# Patient Record
Sex: Male | Born: 1980 | Race: Black or African American | Hispanic: No | Marital: Single | Smoking: Current every day smoker
Health system: Southern US, Community
[De-identification: ages and names within clinical notes are randomized; demographics above are authoritative.]

## PROBLEM LIST (undated history)

## (undated) ENCOUNTER — Emergency Department
Admission: EM | Payer: BC Managed Care – PPO | Source: Home / Self Care | Attending: Emergency Medicine | Admitting: Emergency Medicine

## (undated) DIAGNOSIS — M1990 Unspecified osteoarthritis, unspecified site: Secondary | ICD-10-CM

## (undated) DIAGNOSIS — I10 Essential (primary) hypertension: Secondary | ICD-10-CM

---

## 2006-07-02 ENCOUNTER — Emergency Department: Payer: Self-pay | Admitting: Emergency Medicine

## 2007-01-13 ENCOUNTER — Emergency Department: Payer: Self-pay

## 2007-03-03 ENCOUNTER — Emergency Department: Payer: Self-pay | Admitting: Emergency Medicine

## 2007-03-04 ENCOUNTER — Emergency Department: Payer: Self-pay | Admitting: Emergency Medicine

## 2007-03-05 ENCOUNTER — Ambulatory Visit: Payer: Self-pay | Admitting: Specialist

## 2007-05-23 ENCOUNTER — Emergency Department: Payer: Self-pay | Admitting: Emergency Medicine

## 2008-01-03 ENCOUNTER — Emergency Department: Payer: Self-pay | Admitting: Emergency Medicine

## 2008-02-05 ENCOUNTER — Emergency Department: Payer: Self-pay | Admitting: Emergency Medicine

## 2012-02-18 ENCOUNTER — Emergency Department: Payer: Self-pay | Admitting: Emergency Medicine

## 2012-08-01 ENCOUNTER — Emergency Department: Payer: Self-pay | Admitting: Emergency Medicine

## 2012-08-08 ENCOUNTER — Emergency Department: Payer: Self-pay | Admitting: Emergency Medicine

## 2012-08-25 ENCOUNTER — Emergency Department: Payer: Self-pay | Admitting: Emergency Medicine

## 2013-01-06 ENCOUNTER — Emergency Department: Payer: Self-pay | Admitting: Emergency Medicine

## 2014-05-08 ENCOUNTER — Emergency Department: Payer: Self-pay | Admitting: Emergency Medicine

## 2016-02-08 ENCOUNTER — Encounter: Payer: Self-pay | Admitting: Medical Oncology

## 2016-02-08 ENCOUNTER — Emergency Department: Payer: Self-pay

## 2016-02-08 ENCOUNTER — Emergency Department
Admission: EM | Admit: 2016-02-08 | Discharge: 2016-02-08 | Disposition: A | Discharge status: Home / Self Care | Payer: Self-pay | Attending: Emergency Medicine | Admitting: Emergency Medicine

## 2016-02-08 DIAGNOSIS — Y990 Civilian activity done for income or pay: Secondary | ICD-10-CM | POA: Insufficient documentation

## 2016-02-08 DIAGNOSIS — S39011A Strain of muscle, fascia and tendon of abdomen, initial encounter: Secondary | ICD-10-CM | POA: Insufficient documentation

## 2016-02-08 DIAGNOSIS — X501XXA Overexertion from prolonged static or awkward postures, initial encounter: Secondary | ICD-10-CM | POA: Insufficient documentation

## 2016-02-08 DIAGNOSIS — Y929 Unspecified place or not applicable: Secondary | ICD-10-CM | POA: Insufficient documentation

## 2016-02-08 DIAGNOSIS — Y9301 Activity, walking, marching and hiking: Secondary | ICD-10-CM | POA: Insufficient documentation

## 2016-02-08 DIAGNOSIS — S96911A Strain of unspecified muscle and tendon at ankle and foot level, right foot, initial encounter: Secondary | ICD-10-CM | POA: Insufficient documentation

## 2016-02-08 DIAGNOSIS — S76211A Strain of adductor muscle, fascia and tendon of right thigh, initial encounter: Secondary | ICD-10-CM

## 2016-02-08 MED ORDER — NAPROXEN 500 MG PO TABS
500.0000 mg | ORAL_TABLET | Freq: Two times a day (BID) | ORAL | 0 refills | Status: DC
Start: 2016-02-08 — End: 2016-04-02

## 2016-02-08 MED ORDER — TRAMADOL HCL 50 MG PO TABS: 50.0000 mg | ORAL_TABLET | Freq: Four times a day (QID) | ORAL | 0 refills | Status: DC | PRN

## 2016-02-08 NOTE — ED Notes (Signed)
Patient presents to the ED with right hip pain x 1.5 weeks.  Patient reports walking often at work.  Patient denies known injury.  Patient is in no obvious distress at this time.

## 2016-02-08 NOTE — ED Triage Notes (Signed)
Pt reports that he has been feeling like his hip has to "pop" for the past week. Denies injury. Pt is ambulatory.

## 2016-02-08 NOTE — ED Provider Notes (Signed)
[facility] Emergency Department Provider Note   ____________________________________________   First MD Initiated Contact with Patient 02/08/16 1114     (approximate)  I have reviewed the triage vital signs and the nursing notes.   HISTORY  Chief Complaint Hip Pain    HPI Erik Atkinson is a 35 y.o. male patient complaining of right anterior hip pain for one half weeks. Patient states no specific provocative incident for his complaint. Patient reports on lateral walking at work. He states it feels like his hip needs to "pop" to get better. They pain increase with ambulation. Patient rates his pain as a 7/10. No palliative measures taken for this complaint.  No past medical history on file.  There are no active problems to display for this patient.   History reviewed. No pertinent surgical history.  Prior to Admission medications   Medication Sig Start Date End Date Taking? Authorizing Provider  naproxen (NAPROSYN) 500 MG tablet Take 1 tablet (500 mg total) by mouth 2 (two) times daily with a meal. 02/08/16   Joni Reiningonald K Smith, PA-C  traMADol (ULTRAM) 50 MG tablet Take 1 tablet (50 mg total) by mouth every 6 (six) hours as needed. 02/08/16 02/07/17  Joni Reiningonald K Smith, PA-C    Allergies Patient has no known allergies.  No family history on file.  Social History Social History  Substance Use Topics  . Smoking status: Not on file  . Smokeless tobacco: Not on file  . Alcohol use Not on file    Review of Systems Constitutional: No fever/chills Eyes: No visual changes. ENT: No sore throat. Cardiovascular: Denies chest pain. Respiratory: Denies shortness of breath. Gastrointestinal: No abdominal pain.  No nausea, no vomiting.  No diarrhea.  No constipation. Genitourinary: Negative for dysuria. Musculoskeletal: Right inguinal pain.  Skin: Negative for rash. Neurological: Negative for headaches, focal weakness or  numbness.    ____________________________________________   PHYSICAL EXAM:  VITAL SIGNS: ED Triage Vitals [02/08/16 1045]  Enc Vitals Group     BP      Pulse      Resp      Temp      Temp src      SpO2      Weight 230 lb (104.3 kg)     Height 5\' 11"  (1.803 m)     Head Circumference      Peak Flow      Pain Score 7     Pain Loc      Pain Edu?      Excl. in GC?     Constitutional: Alert and oriented. Well appearing and in no acute distress. Eyes: Conjunctivae are normal. PERRL. EOMI. Head: Atraumatic. Nose: No congestion/rhinnorhea. Mouth/Throat: Mucous membranes are moist.  Oropharynx non-erythematous. Neck: No stridor.  No cervical spine tenderness to palpation. Hematological/Lymphatic/Immunilogical: No cervical lymphadenopathy. Cardiovascular: Normal rate, regular rhythm. Grossly normal heart sounds.  Good peripheral circulation. Respiratory: Normal respiratory effort.  No retractions. Lungs CTAB. Gastrointestinal: Soft and nontender. No distention. No abdominal bruits. No CVA tenderness. Musculoskeletal: No obvious hip or pelvic deformity. No leg length discrepancy. Patient has some moderate guarding in the right inguinal area. Patient has increased guarding with adduction of the hip .  Neurologic:  Normal speech and language. No gross focal neurologic deficits are appreciated. No gait instability. Skin:  Skin is warm, dry and intact. No rash noted. Psychiatric: Mood and affect are normal. Speech and behavior are normal.  ____________________________________________   LABS (all labs ordered  are listed, but only abnormal results are displayed)  Labs Reviewed - No data to display ____________________________________________  EKG   ____________________________________________  RADIOLOGY  No acute findings x-ray of the right hip. ____________________________________________   PROCEDURES  Procedure(s) performed: None  Procedures  Critical Care  performed: No  ____________________________________________   INITIAL IMPRESSION / ASSESSMENT AND PLAN / ED COURSE  Pertinent labs & imaging results that were available during my care of the patient were reviewed by me and considered in my medical decision making (see chart for details).  Right ankle strain. Patient given discharge care instructions. Patient given prescription for tramadol and naproxen. [facility].  Clinical Course      ____________________________________________   FINAL CLINICAL IMPRESSION(S) / ED DIAGNOSES  Final diagnoses:  Inguinal strain, right, initial encounter      NEW MEDICATIONS STARTED DURING THIS VISIT:  New Prescriptions   NAPROXEN (NAPROSYN) 500 MG TABLET    Take 1 tablet (500 mg total) by mouth 2 (two) times daily with a meal.   TRAMADOL (ULTRAM) 50 MG TABLET    Take 1 tablet (50 mg total) by mouth every 6 (six) hours as needed.     Note:  This document was prepared using Dragon voice recognition software and may include unintentional dictation errors.    Joni ReiningRonald K Smith, PA-C 02/08/16 1243    Myrna Blazeravid Matthew Schaevitz, MD 02/08/16 [phone]

## 2016-04-02 ENCOUNTER — Encounter: Payer: Self-pay | Admitting: *Deleted

## 2016-04-02 ENCOUNTER — Emergency Department
Admission: EM | Admit: 2016-04-02 | Discharge: 2016-04-02 | Disposition: A | Discharge status: Home / Self Care | Payer: Self-pay | Attending: Emergency Medicine | Admitting: Emergency Medicine

## 2016-04-02 DIAGNOSIS — J111 Influenza due to unidentified influenza virus with other respiratory manifestations: Secondary | ICD-10-CM | POA: Insufficient documentation

## 2016-04-02 DIAGNOSIS — F17210 Nicotine dependence, cigarettes, uncomplicated: Secondary | ICD-10-CM | POA: Insufficient documentation

## 2016-04-02 LAB — INFLUENZA PANEL BY PCR (TYPE A & B)
INFLAPCR: POSITIVE — AB
Influenza B By PCR: NEGATIVE

## 2016-04-02 MED ORDER — ACETAMINOPHEN 325 MG PO TABS
ORAL_TABLET | ORAL | Status: AC
  Filled 2016-04-02: qty 2

## 2016-04-02 MED ORDER — PROMETHAZINE-DM 6.25-15 MG/5ML PO SYRP: 5.0000 mL | ORAL_SOLUTION | Freq: Four times a day (QID) | ORAL | 0 refills | Status: DC | PRN

## 2016-04-02 MED ORDER — FLUTICASONE PROPIONATE 50 MCG/ACT NA SUSP: 2.0000 | Freq: Every day | NASAL | 0 refills | Status: DC

## 2016-04-02 MED ORDER — ACETAMINOPHEN 325 MG PO TABS
650.0000 mg | ORAL_TABLET | Freq: Once | ORAL | Status: AC | PRN
Start: 2016-04-02 — End: 2016-04-02
  Administered 2016-04-02: 650 mg via ORAL

## 2016-04-02 MED ORDER — OSELTAMIVIR PHOSPHATE 75 MG PO CAPS: 75.0000 mg | ORAL_CAPSULE | Freq: Two times a day (BID) | ORAL | 0 refills | Status: DC

## 2016-04-02 NOTE — ED Triage Notes (Signed)
Pt reports cough, body aches, sore eyes.  Sx for 2 days.  Coughing up green phlegm  cig smoker.   Pt alert

## 2016-04-02 NOTE — ED Provider Notes (Signed)
[facility] Emergency Department Provider Note  ____________________________________________  Time seen: Approximately 4:36 PM  I have reviewed the triage vital signs and the nursing notes.   HISTORY  Chief Complaint Cough    HPI Erik Atkinson is a 36 y.o. male , NAD, presents to the emergency department today history of fevers, chills, body aches, cough and chest congestion. Has been taking over-the-counter NyQuil and DayQuil as well as Alka-Seltzer cold with some relief of symptoms but no resolution. Denies any chest pain, shortness breath, wheezing, abdominal pain, nausea, vomiting, diarrhea. Has had moderate nasal congestion runny nose but no sinus pressure or pain. No sore throat. Does note that he was exposed to a family member with similar symptoms but they had no specific diagnosis. Has not had a flu vaccine during this season.   No past medical history on file.  There are no active problems to display for this patient.   No past surgical history on file.  Prior to Admission medications   Medication Sig Start Date End Date Taking? Authorizing Provider  fluticasone (FLONASE) 50 MCG/ACT nasal spray Place 2 sprays into both nostrils daily. 04/02/16   Jami L Hagler, PA-C  oseltamivir (TAMIFLU) 75 MG capsule Take 1 capsule (75 mg total) by mouth 2 (two) times daily. 04/02/16   Jami L Hagler, PA-C  promethazine-dextromethorphan (PROMETHAZINE-DM) 6.25-15 MG/5ML syrup Take 5 mLs by mouth 4 (four) times daily as needed for cough. 04/02/16   Jami L Hagler, PA-C    Allergies Patient has no known allergies.  No family history on file.  Social History Social History  Substance Use Topics  . Smoking status: Current Every Day Smoker  . Smokeless tobacco: Current User  . Alcohol use Yes     Review of Systems  Constitutional: Positive fevers, chills, fatigue. Eyes: No visual changes. No discharge ENT: Positive nasal congestion, runny nose, sinus pressure. No  sore throat, ear pain. Cardiovascular: No chest pain. Respiratory: Positive cough, chest congestion. No shortness of breath. No wheezing.  Gastrointestinal: No abdominal pain.  No nausea, vomiting.  No diarrhea.   Musculoskeletal: Positive for general myalgias.  Skin: Negative for rash. Neurological: Negative for headaches, focal weakness or numbness. 10-point ROS otherwise negative.  ____________________________________________   PHYSICAL EXAM:  VITAL SIGNS: ED Triage Vitals  Enc Vitals Group     BP 04/02/16 1531 130/76     Pulse Rate 04/02/16 1531 (!) 109     Resp --      Temp 04/02/16 1531 (!) 100.8 F (38.2 C)     Temp Source 04/02/16 1531 Oral     SpO2 04/02/16 1531 98 %     Weight 04/02/16 1540 250 lb (113.4 kg)     Height 04/02/16 1540 5\' 11"  (1.803 m)     Head Circumference --      Peak Flow --      Pain Score 04/02/16 1541 8     Pain Loc --      Pain Edu? --      Excl. in GC? --      Constitutional: Alert and oriented. Well appearing and in no acute distress. Eyes: Conjunctivae are normal Without icterus, injection or discharge. Head: Atraumatic. ENT:      Ears: Bilateral ear canals with serous effusion but no bulging, erythema or perforation.      Nose: Moderate congestion with profuse clear rhinorrhea noted. Turbinates are injected with mild edema.      Mouth/Throat: Mucous membranes are moist.  Pharynx without erythema, swelling, exudate. Uvula is midline. Clear postnasal drip. Neck: No stridor. Supple with full range of motion. Hematological/Lymphatic/Immunilogical: No cervical lymphadenopathy. Cardiovascular: Normal rate, regular rhythm. Normal S1 and S2.  Good peripheral circulation. Respiratory: Normal respiratory effort without tachypnea or retractions. Lungs CTAB with breath sounds noted in all lung fields. No wheeze, rhonchi, rales Neurologic:  Normal speech and language. No gross focal neurologic deficits are appreciated.  Skin:  Skin is warm, dry and  intact. No rash noted. Psychiatric: Mood and affect are normal. Speech and behavior are normal. Patient exhibits appropriate insight and judgement.   ____________________________________________   LABS (all labs ordered are listed, but only abnormal results are displayed)  Labs Reviewed  INFLUENZA PANEL BY PCR (TYPE A & B, H1N1) - Abnormal; Notable for the following:       Result Value   Influenza A By PCR POSITIVE (*)    All other components within normal limits   ____________________________________________  EKG  None ____________________________________________  RADIOLOGY  None ____________________________________________    PROCEDURES  Procedure(s) performed: None   Procedures   Medications  acetaminophen (TYLENOL) tablet 650 mg (650 mg Oral Given 04/02/16 1544)     ____________________________________________   INITIAL IMPRESSION / ASSESSMENT AND PLAN / ED COURSE  Pertinent labs & imaging results that were available during my care of the patient were reviewed by me and considered in my medical decision making (see chart for details).  Clinical Course     Patient's diagnosis is consistent with Influenza. Patient will be discharged home with prescriptions for Tamiflu, Flonase, promethazine DM to take as directed. Patient may continue over-the-counter Tylenol or ibuprofen as needed for fever or aches. Patient was given a work note to excuse from work over the next 48 hours. Patient is to follow up with [facility] community clinic if symptoms persist past this treatment course. Patient is given ED precautions to return to the ED for any worsening or new symptoms.    ____________________________________________  FINAL CLINICAL IMPRESSION(S) / ED DIAGNOSES  Final diagnoses:  Influenza      NEW MEDICATIONS STARTED DURING THIS VISIT:  Discharge Medication List as of 04/02/2016  4:42 PM    START taking these medications   Details  fluticasone (FLONASE)  50 MCG/ACT nasal spray Place 2 sprays into both nostrils daily., Starting Tue 04/02/2016, Print    oseltamivir (TAMIFLU) 75 MG capsule Take 1 capsule (75 mg total) by mouth 2 (two) times daily., Starting Tue 04/02/2016, Print    promethazine-dextromethorphan (PROMETHAZINE-DM) 6.25-15 MG/5ML syrup Take 5 mLs by mouth 4 (four) times daily as needed for cough., Starting Tue 04/02/2016, Print             Hope Pigeon, PA-C 04/02/16 1749    Nita Sickle, MD 04/02/16 [phone]

## 2016-04-02 NOTE — ED Notes (Signed)
See triage note  Fever  Body aches and cough for couple of days

## 2017-04-22 ENCOUNTER — Other Ambulatory Visit: Payer: Self-pay

## 2017-04-22 ENCOUNTER — Encounter: Payer: Self-pay | Admitting: Emergency Medicine

## 2017-04-22 ENCOUNTER — Emergency Department
Admission: EM | Admit: 2017-04-22 | Discharge: 2017-04-22 | Disposition: A | Discharge status: Home / Self Care | Payer: Self-pay | Attending: Emergency Medicine | Admitting: Emergency Medicine

## 2017-04-22 DIAGNOSIS — B9789 Other viral agents as the cause of diseases classified elsewhere: Secondary | ICD-10-CM | POA: Insufficient documentation

## 2017-04-22 DIAGNOSIS — F17200 Nicotine dependence, unspecified, uncomplicated: Secondary | ICD-10-CM | POA: Insufficient documentation

## 2017-04-22 DIAGNOSIS — J069 Acute upper respiratory infection, unspecified: Secondary | ICD-10-CM | POA: Insufficient documentation

## 2017-04-22 DIAGNOSIS — B309 Viral conjunctivitis, unspecified: Secondary | ICD-10-CM | POA: Insufficient documentation

## 2017-04-22 MED ORDER — FEXOFENADINE-PSEUDOEPHED ER 60-120 MG PO TB12: 1.0000 | ORAL_TABLET | Freq: Two times a day (BID) | ORAL | 0 refills | Status: DC

## 2017-04-22 MED ORDER — BENZONATATE 100 MG PO CAPS: 200.0000 mg | ORAL_CAPSULE | Freq: Three times a day (TID) | ORAL | 0 refills | Status: AC | PRN

## 2017-04-22 MED ORDER — IBUPROFEN 600 MG PO TABS: 600.0000 mg | ORAL_TABLET | Freq: Three times a day (TID) | ORAL | 0 refills | Status: DC | PRN

## 2017-04-22 MED ORDER — NAPHAZOLINE-PHENIRAMINE 0.025-0.3 % OP SOLN: 1.0000 [drp] | Freq: Four times a day (QID) | OPHTHALMIC | 0 refills | Status: DC | PRN

## 2017-04-22 NOTE — ED Triage Notes (Signed)
Presents with left eye redness and pain   But also has had a lot of sinus pressure  Some cough

## 2017-04-22 NOTE — ED Provider Notes (Signed)
[facility] Emergency Department Provider Note   ____________________________________________   First MD Initiated Contact with Patient 04/22/17 1143     (approximate)  I have reviewed the triage vital signs and the nursing notes.   HISTORY  Chief Complaint Eye Pain    HPI Erik Atkinson is a 37 y.o. male patient complained of 4 days of sinus pressure, cough, watery drainage from left eye, and body pain.  Patient denies fever, nausea, vomiting, or diarrhea.  No pulses measured for complaint.  Patient describes pain as "achy".  Patient denies vision disturbance.  No history of contact usage.  History reviewed. No pertinent past medical history.  There are no active problems to display for this patient.   History reviewed. No pertinent surgical history.  Prior to Admission medications   Medication Sig Start Date End Date Taking? Authorizing Provider  benzonatate (TESSALON PERLES) 100 MG capsule Take 2 capsules (200 mg total) by mouth 3 (three) times daily as needed. 04/22/17 04/22/18  Joni Reining, PA-C  fexofenadine-pseudoephedrine (ALLEGRA-D) 60-120 MG 12 hr tablet Take 1 tablet by mouth 2 (two) times daily. 04/22/17   Joni Reining, PA-C  fluticasone (FLONASE) 50 MCG/ACT nasal spray Place 2 sprays into both nostrils daily. 04/02/16   Hagler, Jami L, PA-C  ibuprofen (ADVIL,MOTRIN) 600 MG tablet Take 1 tablet (600 mg total) by mouth every 8 (eight) hours as needed. 04/22/17   Joni Reining, PA-C  naphazoline-pheniramine (NAPHCON-A) 0.025-0.3 % ophthalmic solution Place 1 drop into both eyes 4 (four) times daily as needed for eye irritation. 04/22/17   Joni Reining, PA-C  oseltamivir (TAMIFLU) 75 MG capsule Take 1 capsule (75 mg total) by mouth 2 (two) times daily. 04/02/16   Hagler, Jami L, PA-C  promethazine-dextromethorphan (PROMETHAZINE-DM) 6.25-15 MG/5ML syrup Take 5 mLs by mouth 4 (four) times daily as needed for cough. 04/02/16   Hagler, Jami L, PA-C      Allergies Patient has no known allergies.  No family history on file.  Social History Social History   Tobacco Use  . Smoking status: Current Every Day Smoker  . Smokeless tobacco: Current User  Substance Use Topics  . Alcohol use: Yes  . Drug use: Not on file    Review of Systems Constitutional: No fever/chills Eyes: No visual changes.  Clear drainage left eye ENT: No sore throat.  Sinus congestion Cardiovascular: Denies chest pain. Respiratory: Denies shortness of breath.  Nonproductive cough Gastrointestinal: No abdominal pain.  No nausea, no vomiting.  No diarrhea.  No constipation. Genitourinary: Negative for dysuria. Musculoskeletal: Negative for back pain. Skin: Negative for rash. Neurological: Negative for headaches, focal weakness or numbness.   ____________________________________________   PHYSICAL EXAM:  VITAL SIGNS: ED Triage Vitals  Enc Vitals Group     BP 04/22/17 1102 135/88     Pulse Rate 04/22/17 1102 78     Resp 04/22/17 1102 20     Temp 04/22/17 1102 98.7 F (37.1 C)     Temp src --      SpO2 04/22/17 1102 98 %     Weight 04/22/17 1103 230 lb (104.3 kg)     Height 04/22/17 1103 5\' 11"  (1.803 m)     Head Circumference --      Peak Flow --      Pain Score --      Pain Loc --      Pain Edu? --      Excl. in GC? --  Constitutional: Alert and oriented. Well appearing and in no acute distress. Eyes: Erythematous left conjunctiva l. PERRL. EOMI. Nose: Edematous nasal turbinates bilateral maxillary guarding Mouth/Throat: Mucous membranes are moist.  Oropharynx non-erythematous. Neck: No stridor.  Hematological/Lymphatic/Immunilogical: No cervical lymphadenopathy. Cardiovascular: Normal rate, regular rhythm. Grossly normal heart sounds.  Good peripheral circulation. Respiratory: Normal respiratory effort.  No retractions. Lungs CTAB.  Nonproductive cough Neurologic:  Normal speech and language. No gross focal neurologic deficits are  appreciated. No gait instability. Skin:  Skin is warm, dry and intact. No rash noted. Psychiatric: Mood and affect are normal. Speech and behavior are normal.  ____________________________________________   LABS (all labs ordered are listed, but only abnormal results are displayed)  Labs Reviewed - No data to display ____________________________________________  EKG   ____________________________________________  RADIOLOGY  No results found.  ____________________________________________   PROCEDURES  Procedure(s) performed: None  Procedures  Critical Care performed: No  ____________________________________________   INITIAL IMPRESSION / ASSESSMENT AND PLAN / ED COURSE  As part of my medical decision making, I reviewed the following data within the electronic MEDICAL RECORD NUMBER    Viral conjunctivitis of the left.  Viral upper respiratory infection.  Patient given discharge care instruction advised take medication as directed.  Patient [facility].      ____________________________________________   FINAL CLINICAL IMPRESSION(S) / ED DIAGNOSES  Final diagnoses:  Acute viral conjunctivitis of right eye  Viral upper respiratory tract infection     ED Discharge Orders        Ordered    naphazoline-pheniramine (NAPHCON-A) 0.025-0.3 % ophthalmic solution  4 times daily PRN     04/22/17 1149    fexofenadine-pseudoephedrine (ALLEGRA-D) 60-120 MG 12 hr tablet  2 times daily     04/22/17 1149    benzonatate (TESSALON PERLES) 100 MG capsule  3 times daily PRN     04/22/17 1149    ibuprofen (ADVIL,MOTRIN) 600 MG tablet  Every 8 hours PRN     04/22/17 1149       Note:  This document was prepared using Dragon voice recognition software and may include unintentional dictation errors.    Joni ReiningSmith, Santrice Muzio K, PA-C 04/22/17 1154    Sharman CheekStafford, Phillip, MD 04/22/17 1525

## 2020-02-27 ENCOUNTER — Emergency Department
Admission: EM | Admit: 2020-02-27 | Discharge: 2020-02-27 | Disposition: A | Discharge status: Home / Self Care | Payer: Self-pay | Attending: Emergency Medicine | Admitting: Emergency Medicine

## 2020-02-27 ENCOUNTER — Emergency Department: Payer: Self-pay

## 2020-02-27 ENCOUNTER — Encounter: Payer: Self-pay | Admitting: Emergency Medicine

## 2020-02-27 ENCOUNTER — Other Ambulatory Visit: Payer: Self-pay

## 2020-02-27 DIAGNOSIS — S0101XA Laceration without foreign body of scalp, initial encounter: Secondary | ICD-10-CM | POA: Insufficient documentation

## 2020-02-27 DIAGNOSIS — S0181XA Laceration without foreign body of other part of head, initial encounter: Secondary | ICD-10-CM | POA: Insufficient documentation

## 2020-02-27 DIAGNOSIS — Z23 Encounter for immunization: Secondary | ICD-10-CM | POA: Insufficient documentation

## 2020-02-27 DIAGNOSIS — S0003XA Contusion of scalp, initial encounter: Secondary | ICD-10-CM

## 2020-02-27 DIAGNOSIS — F17200 Nicotine dependence, unspecified, uncomplicated: Secondary | ICD-10-CM | POA: Insufficient documentation

## 2020-02-27 LAB — CBC WITH DIFFERENTIAL/PLATELET
Abs Immature Granulocytes: 0.03 10*3/uL (ref 0.00–0.07)
Basophils Absolute: 0 10*3/uL (ref 0.0–0.1)
Basophils Relative: 0 %
Eosinophils Absolute: 0.1 10*3/uL (ref 0.0–0.5)
Eosinophils Relative: 1 %
HCT: 48.3 % (ref 39.0–52.0)
Hemoglobin: 15.6 g/dL (ref 13.0–17.0)
Immature Granulocytes: 0 %
Lymphocytes Relative: 8 %
Lymphs Abs: 0.6 10*3/uL — ABNORMAL LOW (ref 0.7–4.0)
MCH: 33.7 pg (ref 26.0–34.0)
MCHC: 32.3 g/dL (ref 30.0–36.0)
MCV: 104.3 fL — ABNORMAL HIGH (ref 80.0–100.0)
Monocytes Absolute: 0.4 10*3/uL (ref 0.1–1.0)
Monocytes Relative: 6 %
Neutro Abs: 6.1 10*3/uL (ref 1.7–7.7)
Neutrophils Relative %: 85 %
Platelets: 155 10*3/uL (ref 150–400)
RBC: 4.63 MIL/uL (ref 4.22–5.81)
RDW: 14.1 % (ref 11.5–15.5)
WBC: 7.3 10*3/uL (ref 4.0–10.5)
nRBC: 0 % (ref 0.0–0.2)

## 2020-02-27 LAB — COMPREHENSIVE METABOLIC PANEL
ALT: 16 U/L (ref 0–44)
AST: 34 U/L (ref 15–41)
Albumin: 4.4 g/dL (ref 3.5–5.0)
Alkaline Phosphatase: 72 U/L (ref 38–126)
Anion gap: 12 (ref 5–15)
BUN: 10 mg/dL (ref 6–20)
CO2: 19 mmol/L — ABNORMAL LOW (ref 22–32)
Calcium: 8.8 mg/dL — ABNORMAL LOW (ref 8.9–10.3)
Chloride: 107 mmol/L (ref 98–111)
Creatinine, Ser: 1.33 mg/dL — ABNORMAL HIGH (ref 0.61–1.24)
GFR, Estimated: >60 mL/min (ref >60)
Glucose, Bld: 100 mg/dL — ABNORMAL HIGH (ref 70–99)
Potassium: 4.7 mmol/L (ref 3.5–5.1)
Sodium: 138 mmol/L (ref 135–145)
Total Bilirubin: 1.2 mg/dL (ref 0.3–1.2)
Total Protein: 8 g/dL (ref 6.5–8.1)

## 2020-02-27 LAB — ETHANOL: Alcohol, Ethyl (B): 108 mg/dL — ABNORMAL HIGH (ref <10)

## 2020-02-27 LAB — URINE DRUG SCREEN, QUALITATIVE (ARMC ONLY)
Amphetamines, Ur Screen: NOT DETECTED
Barbiturates, Ur Screen: NOT DETECTED
Benzodiazepine, Ur Scrn: NOT DETECTED
Cannabinoid 50 Ng, Ur ~~LOC~~: POSITIVE — AB
Cocaine Metabolite,Ur ~~LOC~~: NOT DETECTED
MDMA (Ecstasy)Ur Screen: NOT DETECTED
Methadone Scn, Ur: NOT DETECTED
Opiate, Ur Screen: NOT DETECTED
Phencyclidine (PCP) Ur S: NOT DETECTED

## 2020-02-27 LAB — URINALYSIS, COMPLETE (UACMP) WITH MICROSCOPIC
Bacteria, UA: NONE SEEN
Bilirubin Urine: NEGATIVE
Glucose, UA: NEGATIVE mg/dL
Ketones, ur: NEGATIVE mg/dL
Leukocytes,Ua: NEGATIVE
Nitrite: NEGATIVE
Protein, ur: NEGATIVE mg/dL
Specific Gravity, Urine: 1.008 (ref 1.005–1.030)
Squamous Epithelial / LPF: NONE SEEN (ref 0–5)
pH: 5 (ref 5.0–8.0)

## 2020-02-27 MED ORDER — TETANUS-DIPHTH-ACELL PERTUSSIS 5-2.5-18.5 LF-MCG/0.5 IM SUSY
0.5000 mL | PREFILLED_SYRINGE | Freq: Once | INTRAMUSCULAR | Status: AC
Start: 2020-02-27 — End: 2020-02-27
  Administered 2020-02-27: 09:00:00 0.5 mL via INTRAMUSCULAR
  Filled 2020-02-27: qty 0.5
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
## 2020-02-27 NOTE — Discharge Instructions (Addendum)
Follow-up the [facility] acute care or your primary care provider for staple removal in 7 days.  Keep areas clean and dry.  The Steri-Strip tape that was placed on your face will fall off on its own which usually takes about 5 to 7 days.  You may take Tylenol if needed for pain.  Read the information about head injuries and return to the emergency department immediately if you develop any difficulty with vision, projectile vomiting which is forceful, disorientation, or urgent concerns.
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
## 2020-02-27 NOTE — ED Notes (Signed)
Pt ambulatory with steady gait upon d.c

## 2020-03-09 ENCOUNTER — Ambulatory Visit
Admission: EM | Admit: 2020-03-09 | Discharge: 2020-03-09 | Disposition: A | Discharge status: Home / Self Care | Payer: BC Managed Care – PPO

## 2020-03-09 ENCOUNTER — Other Ambulatory Visit: Payer: Self-pay

## 2020-03-09 NOTE — ED Triage Notes (Signed)
Patient with 2 staples to top of scalp placed at [facility] on 11/28. Wound is well healed with no signs of infection.   2 staples removed. No bleeding. Pt tolerated well.

## 2021-08-13 ENCOUNTER — Ambulatory Visit
Admission: EM | Admit: 2021-08-13 | Discharge: 2021-08-13 | Disposition: A | Discharge status: Home / Self Care | Payer: BC Managed Care – PPO

## 2021-08-13 DIAGNOSIS — S8011XA Contusion of right lower leg, initial encounter: Secondary | ICD-10-CM | POA: Diagnosis not present

## 2021-08-13 DIAGNOSIS — W19XXXA Unspecified fall, initial encounter: Secondary | ICD-10-CM

## 2021-08-13 DIAGNOSIS — R030 Elevated blood-pressure reading, without diagnosis of hypertension: Secondary | ICD-10-CM | POA: Diagnosis not present

## 2021-08-13 DIAGNOSIS — Y92009 Unspecified place in unspecified non-institutional (private) residence as the place of occurrence of the external cause: Secondary | ICD-10-CM
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
## 2021-08-13 NOTE — Discharge Instructions (Addendum)
Recommend continue OTC Aleve 2 to 3 tablets every 12 hours as needed for pain and swelling. May continue to soak area or apply warm moist compresses to area for comfort. Recommend continue to monitor your blood pressure. If it still remains elevated of >140/>90, you will need to see a primary care provider for further evaluation and management.  ?
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
## 2022-01-07 ENCOUNTER — Other Ambulatory Visit: Payer: Self-pay

## 2022-01-07 DIAGNOSIS — I10 Essential (primary) hypertension: Secondary | ICD-10-CM | POA: Diagnosis not present

## 2022-01-07 DIAGNOSIS — H6993 Unspecified Eustachian tube disorder, bilateral: Secondary | ICD-10-CM | POA: Insufficient documentation

## 2022-01-07 DIAGNOSIS — J0110 Acute frontal sinusitis, unspecified: Secondary | ICD-10-CM | POA: Diagnosis not present

## 2022-01-07 DIAGNOSIS — Z20822 Contact with and (suspected) exposure to covid-19: Secondary | ICD-10-CM | POA: Insufficient documentation

## 2022-01-07 DIAGNOSIS — R519 Headache, unspecified: Secondary | ICD-10-CM | POA: Diagnosis present

## 2022-01-07 LAB — SARS CORONAVIRUS 2 BY RT PCR: SARS Coronavirus 2 by RT PCR: NEGATIVE

## 2022-01-07 NOTE — ED Triage Notes (Signed)
Pt presents via POV c/o eyes watering, headache, eye pain. Reports thinks he has a sinus infection per pt report .

## 2022-01-08 ENCOUNTER — Emergency Department
Admission: EM | Admit: 2022-01-08 | Discharge: 2022-01-08 | Disposition: A | Discharge status: Home / Self Care | Payer: BC Managed Care – PPO | Attending: Emergency Medicine | Admitting: Emergency Medicine

## 2022-01-08 DIAGNOSIS — R0981 Nasal congestion: Secondary | ICD-10-CM

## 2022-01-08 DIAGNOSIS — J0110 Acute frontal sinusitis, unspecified: Secondary | ICD-10-CM

## 2022-01-08 DIAGNOSIS — H6993 Unspecified Eustachian tube disorder, bilateral: Secondary | ICD-10-CM

## 2022-01-08 HISTORY — DX: Unspecified osteoarthritis, unspecified site: M19.90

## 2022-01-08 HISTORY — DX: Essential (primary) hypertension: I10

## 2022-01-08 MED ORDER — AZITHROMYCIN 250 MG PO TABS: 250.0000 mg | ORAL_TABLET | Freq: Every day | ORAL | 0 refills | Status: DC

## 2022-01-08 MED ORDER — PREDNISONE 20 MG PO TABS
30.0000 mg | ORAL_TABLET | Freq: Once | ORAL | Status: AC
  Administered 2022-01-08: 30 mg via ORAL
  Filled 2022-01-08: qty 1

## 2022-01-08 MED ORDER — METHYLPREDNISOLONE 4 MG PO TBPK: ORAL_TABLET | ORAL | 0 refills | Status: DC

## 2022-01-08 MED ORDER — AZITHROMYCIN 500 MG PO TABS
500.0000 mg | ORAL_TABLET | Freq: Once | ORAL | Status: AC
  Administered 2022-01-08: 500 mg via ORAL
  Filled 2022-01-08: qty 1

## 2022-01-08 NOTE — ED Provider Notes (Signed)
[facility] Provider Note    Event Date/Time   First MD Initiated Contact with Patient 01/08/22 [phone]     (approximate)   History   Nasal Congestion   HPI  Erik Atkinson is a 41 y.o. male who presents to the ED from home with a chief complaint of eyes watering, sinus pressure, headache and mild cough.  Symptoms times several days.  Denies fever, chills, chest pain, shortness of breath, abdominal pain, nausea, vomiting or dizziness.     Past Medical History   Past Medical History:  Diagnosis Date   Arthritis    Hypertension      Active Problem List  There are no problems to display for this patient.    Past Surgical History  History reviewed. No pertinent surgical history.   Home Medications   Prior to Admission medications   Medication Sig Start Date End Date Taking? Authorizing Provider  azithromycin (ZITHROMAX) 250 MG tablet Take 1 tablet (250 mg total) by mouth daily. 01/08/22  Yes Irean Hong, MD  methylPREDNISolone (MEDROL DOSEPAK) 4 MG TBPK tablet Take as directed 01/08/22  Yes Irean Hong, MD  naproxen sodium (ALEVE) 220 MG tablet Take 440 mg by mouth.    [provider]  fluticasone (FLONASE) 50 MCG/ACT nasal spray Place 2 sprays into both nostrils daily. 04/02/16 02/27/20  Hagler, Jami L, PA-C     Allergies  Penicillins   Family History  History reviewed. No pertinent family history.   Physical Exam  Triage Vital Signs: ED Triage Vitals  Enc Vitals Group     BP 01/07/22 2236 (!) 154/102     Pulse Rate 01/07/22 2232 79     Resp 01/07/22 2232 14     Temp 01/07/22 2232 98.4 F (36.9 C)     Temp Source 01/07/22 2232 Oral     SpO2 01/07/22 2232 96 %     Weight 01/07/22 2233 255 lb (115.7 kg)     Height 01/07/22 2233 5\' 11"  (1.803 m)     Head Circumference --      Peak Flow --      Pain Score 01/07/22 2232 7     Pain Loc --      Pain Edu? --      Excl. in GC? --     Updated Vital Signs: BP (!) 154/102    Pulse 79   Temp 98.4 F (36.9 C) (Oral)   Resp 14   Ht 5\' 11"  (1.803 m)   Wt 115.7 kg   SpO2 96%   BMI 35.57 kg/m    General: Awake, no distress.  CV:  RRR.  Good peripheral perfusion.  Resp:  Normal effort.  CTA B. Abd:  Nontender.  No distention.  Other:  Bilateral TMs not erythematous with fluid behind them.  Frontal sinus tender to palpation.  Nasal congestion noted.  Postnasal drip noted in posterior oropharynx otherwise unremarkable.  Shotty anterior cervical lymphadenopathy.   ED Results / Procedures / Treatments  Labs (all labs ordered are listed, but only abnormal results are displayed) Labs Reviewed  SARS CORONAVIRUS 2 BY RT PCR     EKG  None   RADIOLOGY None   Official radiology report(s): No results found.   PROCEDURES:  Critical Care performed: No  Procedures   MEDICATIONS ORDERED IN ED: Medications  predniSONE (DELTASONE) tablet 30 mg (has no administration in time range)  azithromycin (ZITHROMAX) tablet 500 mg (has no administration in time range)  IMPRESSION / MDM / ASSESSMENT AND PLAN / ED COURSE  I reviewed the triage vital signs and the nursing notes.                             41 year old male presenting with sinus pressure.  Will start Medrol Dosepak for eustachian tube dysfunction, Z-Pak.  COVID is negative.  Strict return precautions given.  Patient verbalizes understanding and agrees with plan of care.  Patient's presentation is most consistent with acute, uncomplicated illness.  FINAL CLINICAL IMPRESSION(S) / ED DIAGNOSES   Final diagnoses:  Nasal congestion  Acute frontal sinusitis, recurrence not specified  Dysfunction of both eustachian tubes     Rx / DC Orders   ED Discharge Orders          Ordered    methylPREDNISolone (MEDROL DOSEPAK) 4 MG TBPK tablet        01/08/22 0329    azithromycin (ZITHROMAX) 250 MG tablet  Daily        01/08/22 0329             Note:  This document was prepared using  Dragon voice recognition software and may include unintentional dictation errors.   Paulette Blanch, MD 01/08/22 [phone]

## 2022-01-08 NOTE — Discharge Instructions (Signed)
1.  Take and finish antibiotic as prescribed  (Azithromycin 250 mg daily x4 days). 2.  Take steroid taper as prescribed. 3.  Return to the ER for worsening symptoms, persistent vomiting, difficulty breathing or other concerns.

## 2022-01-23 ENCOUNTER — Ambulatory Visit (INDEPENDENT_AMBULATORY_CARE_PROVIDER_SITE_OTHER): Payer: BC Managed Care – PPO | Admitting: Internal Medicine

## 2022-01-23 VITALS — BP 126/90 | HR 78 | Resp 16 | Ht 71.0 in | Wt 267.1 lb

## 2022-01-23 DIAGNOSIS — G4710 Hypersomnia, unspecified: Secondary | ICD-10-CM | POA: Diagnosis not present

## 2022-01-23 DIAGNOSIS — I10 Essential (primary) hypertension: Secondary | ICD-10-CM

## 2022-01-23 DIAGNOSIS — E669 Obesity, unspecified: Secondary | ICD-10-CM

## 2022-01-23 NOTE — Progress Notes (Signed)
Sleep Medicine   Office Visit  Patient Name: Erik Atkinson DOB: 01/22/1981 MRN [id number]    Chief Complaint: Sleep consult  Brief History:  Erik Atkinson presents for an initial consult for sleep evaluation and to establish care. Patient states his sleep quality is poor due to waking tired daily. This is noted most nights. The patient's bed partner reports  loud snoring (especially on his back) at night. The patient relates the following symptoms: snoring that wakes him, very dry mouth in morning, excessive daytime sleepiness, brain fog, lack of focus, moodiness, lack of energy are also present. The patient goes to sleep at 9:30 pm and wakes up at 5:30 am.  Sleep quality is the same when outside home environment.  Patient has noted no restlessness of his legs at night that would disrupt his sleep.  The patient  relates some unusual behavior during the night.  The patient relates no history of psychiatric problems. The Epworth Sleepiness Score is 20 out of 24 .  The patient relates  Cardiovascular risk factors include: HTN.    ROS  General: (-) fever, (-) chills, (-) night sweat Nose and Sinuses: (-) nasal stuffiness or itchiness, (-) postnasal drip, (-) nosebleeds, (-) sinus trouble. Mouth and Throat: (-) sore throat, (-) hoarseness. Neck: (-) swollen glands, (-) enlarged thyroid, (-) neck pain. Respiratory: - cough, - shortness of breath, - wheezing. Neurologic: - numbness, - tingling. Psychiatric: - anxiety, - depression Sleep behavior: -sleep paralysis -hypnogogic hallucinations -dream enactment      +vivid dreams -cataplexy -night terrors -sleep walking   Current Medication: Outpatient Encounter Medications as of 01/23/2022  Medication Sig   diclofenac (VOLTAREN) 50 MG EC tablet Take 50 mg by mouth 2 (two) times daily.   losartan (COZAAR) 25 MG tablet Take 25 mg by mouth daily.   [DISCONTINUED] azithromycin (ZITHROMAX) 250 MG tablet Take 1 tablet (250 mg total) by mouth daily.    [DISCONTINUED] fluticasone (FLONASE) 50 MCG/ACT nasal spray Place 2 sprays into both nostrils daily.   [DISCONTINUED] methylPREDNISolone (MEDROL DOSEPAK) 4 MG TBPK tablet Take as directed   [DISCONTINUED] naproxen sodium (ALEVE) 220 MG tablet Take 440 mg by mouth.   No facility-administered encounter medications on file as of 01/23/2022.    Surgical History: History reviewed. No pertinent surgical history.  Medical History: Past Medical History:  Diagnosis Date   Arthritis    Hypertension     Family History: Non contributory to the present illness  Social History: Social History   Socioeconomic History   Marital status: Single    Spouse name: Not on file   Number of children: Not on file   Years of education: Not on file   Highest education level: Not on file  Occupational History   Not on file  Tobacco Use   Smoking status: Every Day    Types: Cigarettes   Smokeless tobacco: Current  Vaping Use   Vaping Use: Never used  Substance and Sexual Activity   Alcohol use: Yes    Comment: Occassionally, Last night also.   Drug use: Yes    Types: Marijuana   Sexual activity: Not on file  Other Topics Concern   Not on file  Social History Narrative   Not on file   Social Determinants of Health   Financial Resource Strain: Not on file  Food Insecurity: Not on file  Transportation Needs: Not on file  Physical Activity: Not on file  Stress: Not on file  Social Connections: Not on file  Intimate Partner Violence: Not on file    Vital Signs: Blood pressure (!) 126/90, pulse 78, resp. rate 16, height 5\' 11"  (1.803 m), weight 267 lb 1.6 oz (121.2 kg), SpO2 97 %. Body mass index is 37.25 kg/m.   Examination: General Appearance: The patient is well-developed, well-nourished, and in no distress. Neck Circumference: 42cm Skin: Gross inspection of skin unremarkable. Head: normocephalic, no gross deformities. Eyes: no gross deformities noted. ENT: ears appear grossly  normal Neurologic: Alert and oriented. No involuntary movements.    STOP BANG RISK ASSESSMENT S (snore) Have you been told that you snore?     YES   T (tired) Are you often tired, fatigued, or sleepy during the day?   YES  O (obstruction) Do you stop breathing, choke, or gasp during sleep? YES   P (pressure) Do you have or are you being treated for high blood pressure? YES   B (BMI) Is your body index greater than 35 kg/m? YES   A (age) Are you 27 years old or older? NO   N (neck) Do you have a neck circumference greater than 16 inches?   YES   G (gender) Are you a male? YES   TOTAL STOP/BANG "YES" ANSWERS 7                                                               A STOP-Bang score of 2 or less is considered low risk, and a score of 5 or more is high risk for having either moderate or severe OSA. For people who score 3 or 4, doctors may need to perform further assessment to determine how likely they are to have OSA.         EPWORTH SLEEPINESS SCALE:  Scale:  (0)= no chance of dozing; (1)= slight chance of dozing; (2)= moderate chance of dozing; (3)= high chance of dozing  Chance  Situtation    Sitting and reading: 3    Watching TV: 3    Sitting Inactive in public: 3    As a passenger in car: 2      Lying down to rest: 3    Sitting and talking: 2    Sitting quielty after lunch: 2    In a car, stopped in traffic: 1   TOTAL SCORE:   20 out of 24    SLEEP STUDIES:  None   LABS: Recent Results (from the past 2160 hour(s))  SARS Coronavirus 2 by RT PCR (hospital order, performed in [facility] hospital lab) *cepheid single result test* Anterior Nasal Swab     Status: None   Collection Time: 01/07/22 10:34 PM   Specimen: Anterior Nasal Swab  Result Value Ref Range   SARS Coronavirus 2 by RT PCR NEGATIVE NEGATIVE    Comment: (NOTE) SARS-CoV-2 target nucleic acids are NOT DETECTED.  The SARS-CoV-2 RNA is generally detectable in upper and  lower respiratory specimens during the acute phase of infection. The lowest concentration of SARS-CoV-2 viral copies this assay can detect is 250 copies / mL. A negative result does not preclude SARS-CoV-2 infection and should not be used as the sole basis for treatment or other patient management decisions.  A negative result may occur with improper specimen collection / handling, submission of specimen other  than nasopharyngeal swab, presence of viral mutation(s) within the areas targeted by this assay, and inadequate number of viral copies (<250 copies / mL). A negative result must be combined with clinical observations, patient history, and epidemiological information.  Fact Sheet for Patients:   RoadLapTop.co.za  Fact Sheet for Healthcare Providers: http://kim-miller.com/  This test is not yet approved or  cleared by the [place] FDA and has been authorized for detection and/or diagnosis of SARS-CoV-2 by FDA under an Emergency Use Authorization (EUA).  This EUA will remain in effect (meaning this test can be used) for the duration of the COVID-19 declaration under Section 564(b)(1) of the Act, 21 U.S.C. section 360bbb-3(b)(1), unless the authorization is terminated or revoked sooner.  Performed at [facility], [address], [place], [place] [address]     Radiology: No results found.  No results found.  No results found.    Assessment and Plan: Patient Active Problem List   Diagnosis Date Noted   Essential hypertension 01/23/2022     PLAN OSA:   Patient evaluation suggests high risk of sleep disordered breathing due to snoring that wakes him, Fhx of OSA, very dry mouth in morning, excessive daytime sleepiness, brain fog, lack of focus, moodiness, lack of energy, and elevated BMI. Patient has comorbid cardiovascular risk factors including: HTN which could be exacerbated by pathologic sleep-disordered  breathing.  Suggest: PSG to assess/treat the patient's sleep disordered breathing. The patient was also counselled on wt loss to optimize sleep health.   1. Hypersomnia Will order PSG  2. Essential hypertension Continue current medication and f/u with PCP.  3. Obesity (BMI 30-39.9) Obesity Counseling: Had a lengthy discussion regarding patients BMI and weight issues. Patient was instructed on portion control as well as increased activity. Also discussed caloric restrictions with trying to maintain intake less than 2000 Kcal. Discussions were made in accordance with the 5As of weight management. Simple actions such as not eating late and if able to, taking a walk is suggested.    General Counseling: I have discussed the findings of the evaluation and examination with Erik Atkinson.  I have also discussed any further diagnostic evaluation thatmay be needed or ordered today. Erik Atkinson verbalizes understanding of the findings of todays visit. We also reviewed his medications today and discussed drug interactions and side effects including but not limited excessive drowsiness and altered mental states. We also discussed that there is always a risk not just to him but also people around him. he has been encouraged to call the office with any questions or concerns that should arise related to todays visit.  No orders of the defined types were placed in this encounter.       I have personally obtained a history, evaluated the patient, evaluated pertinent data, formulated the assessment and plan and placed orders.  This patient was seen by Lynn Ito, PA-C in collaboration with Dr. Freda Munro as a part of collaborative care agreement.    Yevonne Pax, MD [facility] Diplomate ABMS Pulmonary and Critical Care Medicine Sleep medicine

## 2022-03-11 ENCOUNTER — Other Ambulatory Visit: Payer: Self-pay | Admitting: Family Medicine

## 2022-03-11 DIAGNOSIS — M7121 Synovial cyst of popliteal space [Baker], right knee: Secondary | ICD-10-CM

## 2022-03-11 DIAGNOSIS — M25561 Pain in right knee: Secondary | ICD-10-CM

## 2022-03-23 ENCOUNTER — Ambulatory Visit
Admission: EM | Admit: 2022-03-23 | Discharge: 2022-03-23 | Disposition: A | Discharge status: Home / Self Care | Payer: BC Managed Care – PPO

## 2022-03-23 ENCOUNTER — Ambulatory Visit (INDEPENDENT_AMBULATORY_CARE_PROVIDER_SITE_OTHER): Payer: BC Managed Care – PPO

## 2022-03-23 DIAGNOSIS — M7542 Impingement syndrome of left shoulder: Secondary | ICD-10-CM | POA: Diagnosis not present

## 2022-03-23 MED ORDER — METHOCARBAMOL 500 MG PO TABS: 500.0000 mg | ORAL_TABLET | Freq: Three times a day (TID) | ORAL | 0 refills | Status: DC | PRN

## 2022-03-23 MED ORDER — PREDNISONE 10 MG PO TABS: ORAL_TABLET | ORAL | 0 refills | Status: DC
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
## 2022-04-05 ENCOUNTER — Ambulatory Visit
Admission: RE | Admit: 2022-04-05 | Discharge: 2022-04-05 | Disposition: A | Discharge status: Home / Self Care | Payer: BC Managed Care – PPO | Source: Ambulatory Visit | Attending: Family Medicine | Admitting: Family Medicine

## 2022-04-05 ENCOUNTER — Other Ambulatory Visit: Payer: Self-pay | Admitting: Family Medicine

## 2022-04-05 DIAGNOSIS — M7121 Synovial cyst of popliteal space [Baker], right knee: Secondary | ICD-10-CM

## 2022-04-05 DIAGNOSIS — M25561 Pain in right knee: Secondary | ICD-10-CM

## 2022-05-13 ENCOUNTER — Other Ambulatory Visit: Payer: Self-pay | Admitting: Family Medicine

## 2022-05-13 DIAGNOSIS — G8929 Other chronic pain: Secondary | ICD-10-CM

## 2022-05-17 ENCOUNTER — Other Ambulatory Visit: Payer: BC Managed Care – PPO

## 2022-05-17 ENCOUNTER — Ambulatory Visit
Admission: RE | Admit: 2022-05-17 | Discharge: 2022-05-17 | Disposition: A | Discharge status: Home / Self Care | Payer: BC Managed Care – PPO | Source: Ambulatory Visit | Attending: Family Medicine | Admitting: Family Medicine

## 2022-05-17 DIAGNOSIS — G8929 Other chronic pain: Secondary | ICD-10-CM
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
## 2023-02-18 ENCOUNTER — Ambulatory Visit: Payer: BC Managed Care – PPO | Attending: Otolaryngology

## 2023-02-18 DIAGNOSIS — G479 Sleep disorder, unspecified: Secondary | ICD-10-CM | POA: Insufficient documentation

## 2023-02-18 DIAGNOSIS — G4719 Other hypersomnia: Secondary | ICD-10-CM | POA: Diagnosis not present

## 2023-02-18 DIAGNOSIS — R0683 Snoring: Secondary | ICD-10-CM | POA: Insufficient documentation

## 2023-02-18 DIAGNOSIS — I10 Essential (primary) hypertension: Secondary | ICD-10-CM | POA: Insufficient documentation

## 2024-04-29 ENCOUNTER — Ambulatory Visit
Admission: EM | Admit: 2024-04-29 | Discharge: 2024-04-29 | Disposition: A | Discharge status: Home / Self Care | Attending: Emergency Medicine | Admitting: Emergency Medicine

## 2024-04-29 DIAGNOSIS — J069 Acute upper respiratory infection, unspecified: Secondary | ICD-10-CM | POA: Diagnosis not present

## 2024-04-29 MED ORDER — AZITHROMYCIN 250 MG PO TABS: 250.0000 mg | ORAL_TABLET | Freq: Every day | ORAL | 0 refills | Status: AC

## 2024-04-29 MED ORDER — IPRATROPIUM BROMIDE 0.06 % NA SOLN: 2.0000 | Freq: Four times a day (QID) | NASAL | 12 refills | Status: AC

## 2024-04-29 NOTE — Discharge Instructions (Addendum)
 The azithromycin  daily with food for 5 days for treatment of your sinusitis.  Use the Atrovent  nasal spray, 2 squirts up each nostril every 6 hours, to help with nasal congestion and drainage.  Perform sinus irrigation 2-3 times a day with a NeilMed sinus rinse kit and distilled water.  Do not use tap water.  You can use plain over-the-counter Mucinex every 6 hours to break up the stickiness of the mucus so your body can clear it.  Increase your oral fluid intake to thin out your mucus so that is also able for your body to clear more easily.  Take an over-the-counter probiotic, such as Culturelle-align-activia, 1 hour after each dose of antibiotic to prevent diarrhea.  If you develop any new or worsening symptoms return for reevaluation or see your primary care provider.

## 2024-04-29 NOTE — ED Triage Notes (Signed)
 Sinus pressure/pain, eye drainage, nasal congestion with drainage onset Monday night. No known sick exposure. Slight cough but denies fever.   Patient tried Mucinex with slight relief.

## 2024-04-29 NOTE — ED Provider Notes (Signed)
 " [facility]    CSN: [id number] Arrival date & time: 04/29/24  1541      History   Chief Complaint Chief Complaint  Patient presents with   Nasal Congestion   Sinus Problem    HPI Erik Atkinson is a 44 y.o. male.   HPI  44 year old male with past medical head significant for arthritis and hypertension presents for evaluation of respiratory symptoms that been going on for the last 4 days.  These include headache, nasal congestion, sinus pressure, clear nasal discharge, and infrequent nonproductive cough.  He reports the most time he blows his nose he gets clear discharge from his right eye.  No sore throat or ear pain.  Past Medical History:  Diagnosis Date   Arthritis    Hypertension     Patient Active Problem List   Diagnosis Date Noted   Essential hypertension 01/23/2022    History reviewed. No pertinent surgical history.     Home Medications    Prior to Admission medications  Medication Sig Start Date End Date Taking? Authorizing Provider  azithromycin  (ZITHROMAX  Z-PAK) 250 MG tablet Take 1 tablet (250 mg total) by mouth daily. Take 2 tablets on the first day and then 1 tablet daily thereafter for a total of 5 days of treatment. 04/29/24  Yes Bernardino Ditch, NP  diclofenac (VOLTAREN) 50 MG EC tablet Take 50 mg by mouth 2 (two) times daily. 01/03/22  Yes [provider]  ipratropium (ATROVENT ) 0.06 % nasal spray Place 2 sprays into both nostrils 4 (four) times daily. 04/29/24  Yes Bernardino Ditch, NP  losartan (COZAAR) 25 MG tablet Take 25 mg by mouth daily. 01/02/22  Yes [provider]  fluticasone  (FLONASE ) 50 MCG/ACT nasal spray Place 2 sprays into both nostrils daily. 04/02/16 02/27/20  Hagler, Shasta CROME, PA-C    Family History History reviewed. No pertinent family history.  Social History Social History[1]   Allergies   Penicillins   Review of Systems Review of Systems  Constitutional:  Negative for fever.  HENT:  Positive for  congestion, rhinorrhea and sinus pressure. Negative for ear pain and sore throat.   Eyes:  Positive for discharge. Negative for photophobia, pain, redness, itching and visual disturbance.       Clear discharge  Respiratory:  Positive for cough. Negative for shortness of breath and wheezing.      Physical Exam Triage Vital Signs ED Triage Vitals  Encounter Vitals Group     BP      Girls Systolic BP Percentile      Girls Diastolic BP Percentile      Boys Systolic BP Percentile      Boys Diastolic BP Percentile      Pulse      Resp      Temp      Temp src      SpO2      Weight      Height      Head Circumference      Peak Flow      Pain Score      Pain Loc      Pain Education      Exclude from Growth Chart    No data found.  Updated Vital Signs BP (!) 136/92 (BP Location: Right Arm)   Pulse 80   Temp 98.4 F (36.9 C) (Oral)   Resp 18   SpO2 97%   Visual Acuity Right Eye Distance:   Left Eye Distance:  Bilateral Distance:    Right Eye Near:   Left Eye Near:    Bilateral Near:     Physical Exam Vitals and nursing note reviewed.  Constitutional:      Appearance: Normal appearance. He is not ill-appearing.  HENT:     Head: Normocephalic and atraumatic.     Right Ear: Tympanic membrane, ear canal and external ear normal. There is no impacted cerumen.     Left Ear: Tympanic membrane, ear canal and external ear normal. There is no impacted cerumen.     Nose: Congestion and rhinorrhea present.     Comments: Nasal mucosa is edematous and erythematous, especially on the right versus the left.  Yellow discharge present in both nares.  No tenderness to compression of frontal or maxillary sinuses.    Mouth/Throat:     Mouth: Mucous membranes are moist.     Pharynx: Oropharynx is clear. No oropharyngeal exudate or posterior oropharyngeal erythema.  Eyes:     General: No scleral icterus.       Right eye: No discharge.        Left eye: No discharge.     Extraocular  Movements: Extraocular movements intact.     Conjunctiva/sclera: Conjunctivae normal.     Pupils: Pupils are equal, round, and reactive to light.  Cardiovascular:     Rate and Rhythm: Normal rate and regular rhythm.     Pulses: Normal pulses.     Heart sounds: Normal heart sounds. No murmur heard.    No friction rub. No gallop.  Pulmonary:     Effort: Pulmonary effort is normal.     Breath sounds: Normal breath sounds. No wheezing, rhonchi or rales.  Musculoskeletal:     Cervical back: Normal range of motion and neck supple. No tenderness.  Lymphadenopathy:     Cervical: No cervical adenopathy.  Skin:    General: Skin is warm and dry.     Capillary Refill: Capillary refill takes less than 2 seconds.     Findings: No rash.  Neurological:     General: No focal deficit present.     Mental Status: He is alert and oriented to person, place, and time.      UC Treatments / Results  Labs (all labs ordered are listed, but only abnormal results are displayed) Labs Reviewed - No data to display  EKG   Radiology No results found.  Procedures Procedures (including critical care time)  Medications Ordered in UC Medications - No data to display  Initial Impression / Assessment and Plan / UC Course  I have reviewed the triage vital signs and the nursing notes.  Pertinent labs & imaging results that were available during my care of the patient were reviewed by me and considered in my medical decision making (see chart for details).   Patient is a nontoxic-appearing 44 year old male presenting for evaluation of 4 days worth of respiratory symptoms outlined HPI above.  He reports that he has had similar symptoms in the past when he has had a sinus infection.  His sinuses are nontender to compression but he does have thick yellow discharge in both nares.  He has got marked edema of his nasal mucosa on the right versus the left.  I will treat him for URI with a 5-day course of azithromycin   as well as Atrovent  nasal spray to help with the nasal congestion.  We discussed increasing oral fluid intake to thin mucus out make it easier for his body to clear.  Also plain Mucinex to help break up the stickiness of his mucus to make it easier for his body to clear.  Sinus irrigation also covered.  Return precautions reviewed.  He denies a need for work note.   Final Clinical Impressions(s) / UC Diagnoses   Final diagnoses:  Upper respiratory tract infection, unspecified type     Discharge Instructions      The azithromycin  daily with food for 5 days for treatment of your sinusitis.  Use the Atrovent  nasal spray, 2 squirts up each nostril every 6 hours, to help with nasal congestion and drainage.  Perform sinus irrigation 2-3 times a day with a [employer] sinus rinse kit and distilled water.  Do not use tap water.  You can use plain over-the-counter Mucinex every 6 hours to break up the stickiness of the mucus so your body can clear it.  Increase your oral fluid intake to thin out your mucus so that is also able for your body to clear more easily.  Take an over-the-counter probiotic, such as Culturelle-align-activia, 1 hour after each dose of antibiotic to prevent diarrhea.  If you develop any new or worsening symptoms return for reevaluation or see your primary care provider.      ED Prescriptions     Medication Sig Dispense Auth. Provider   azithromycin  (ZITHROMAX  Z-PAK) 250 MG tablet Take 1 tablet (250 mg total) by mouth daily. Take 2 tablets on the first day and then 1 tablet daily thereafter for a total of 5 days of treatment. 6 tablet Bernardino Ditch, NP   ipratropium (ATROVENT ) 0.06 % nasal spray Place 2 sprays into both nostrils 4 (four) times daily. 15 mL Bernardino Ditch, NP      PDMP not reviewed this encounter.    [1]  Social History Tobacco Use   Smoking status: Every Day    Types: Cigarettes   Smokeless tobacco: Current  Vaping Use   Vaping status: Never Used   Substance Use Topics   Alcohol use: Yes    Comment: Occassionally, Last night also.   Drug use: Yes    Types: Marijuana     Bernardino Ditch, NP 04/29/24 1648  "
